# Patient Record
Sex: Female | Born: 1990 | Race: Black or African American | Hispanic: No | Marital: Married | Smoking: Never smoker
Health system: Southern US, Community
[De-identification: ages and names within clinical notes are randomized; demographics above are authoritative.]

## PROBLEM LIST (undated history)

## (undated) DIAGNOSIS — G43909 Migraine, unspecified, not intractable, without status migrainosus: Secondary | ICD-10-CM

---
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
## 2008-04-05 ENCOUNTER — Ambulatory Visit: Payer: Self-pay | Admitting: Certified Nurse Midwife

## 2008-05-09 ENCOUNTER — Emergency Department: Payer: Self-pay | Admitting: Emergency Medicine

## 2008-07-21 ENCOUNTER — Observation Stay: Payer: Self-pay

## 2008-08-25 ENCOUNTER — Inpatient Hospital Stay: Payer: Self-pay | Admitting: Obstetrics and Gynecology

## 2008-08-25 ENCOUNTER — Ambulatory Visit: Payer: Self-pay | Admitting: Certified Nurse Midwife

## 2009-11-16 IMAGING — CR DG CHEST 2V
1 series · 2 of 2 positions shown · non-contrast
Comparison: none

REASON FOR EXAM: cough - shield abdomen
COMMENTS:

[Series 1: view not recorded · 0.17mm/px · 2 of 2 slices shown]
[im 1/2]
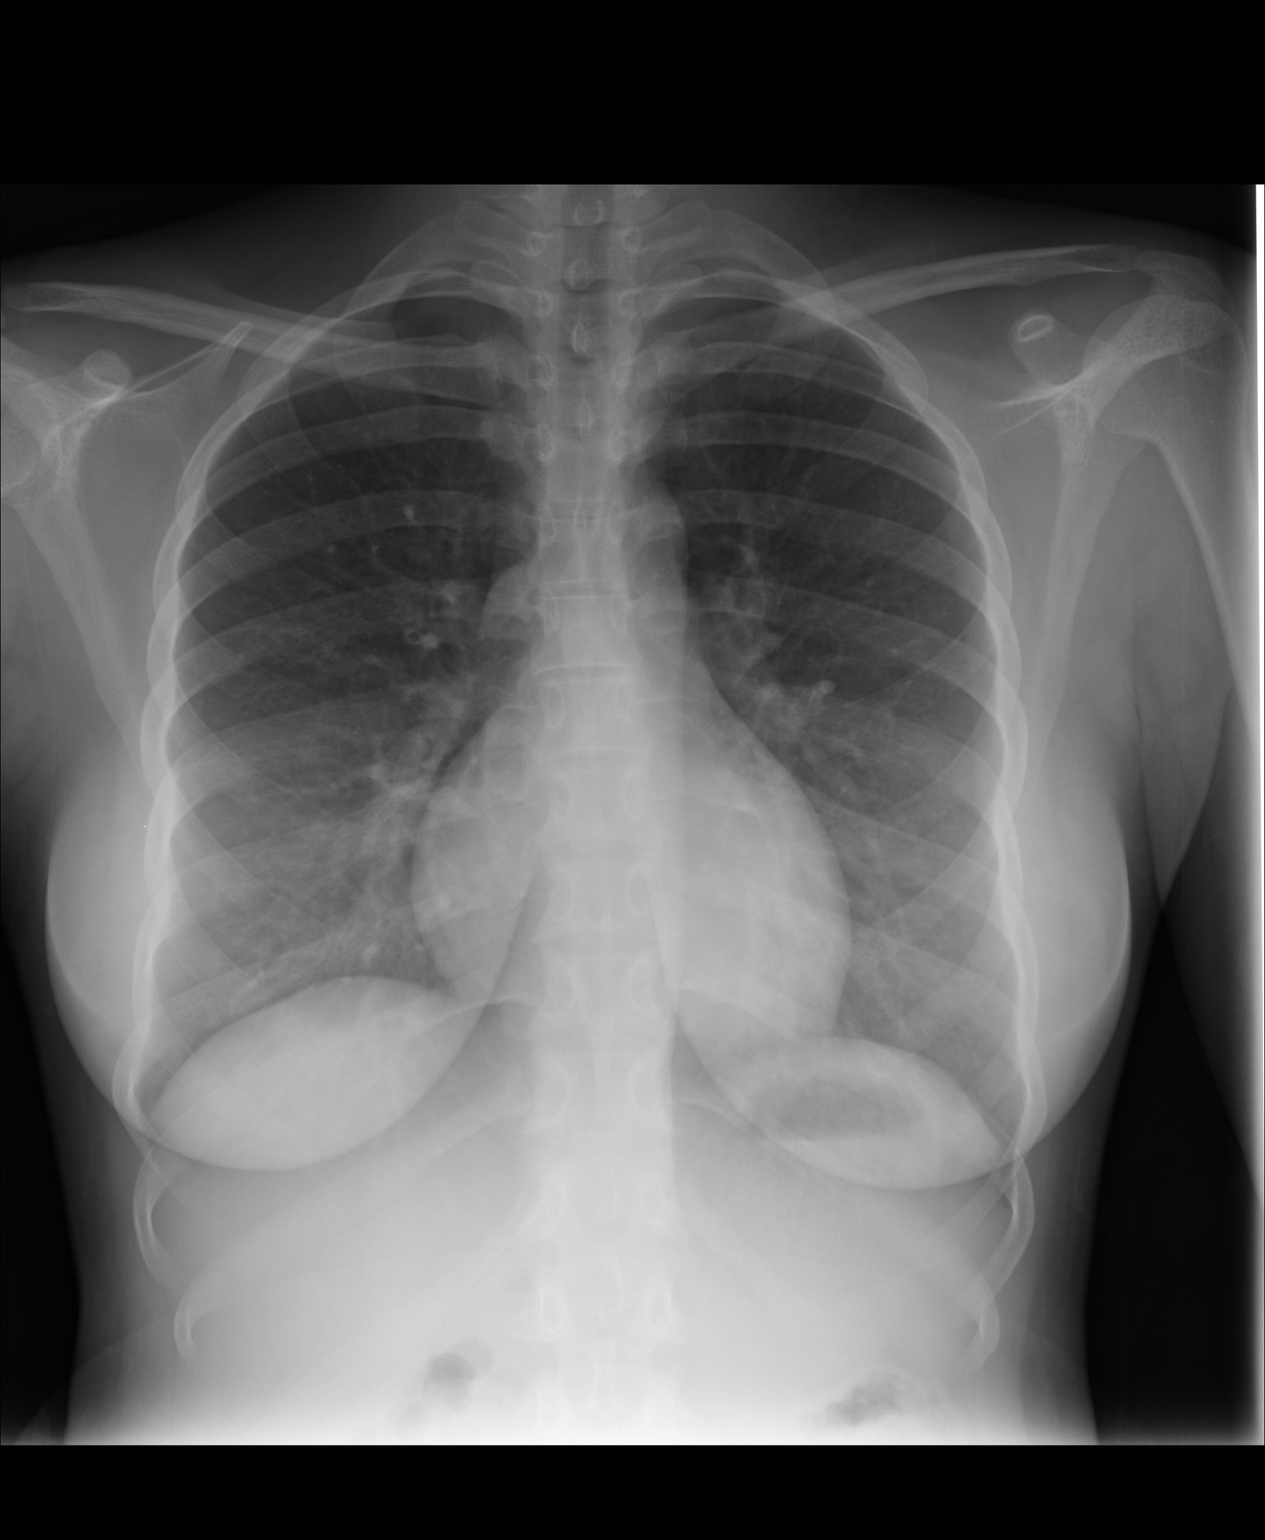
[im 2/2]
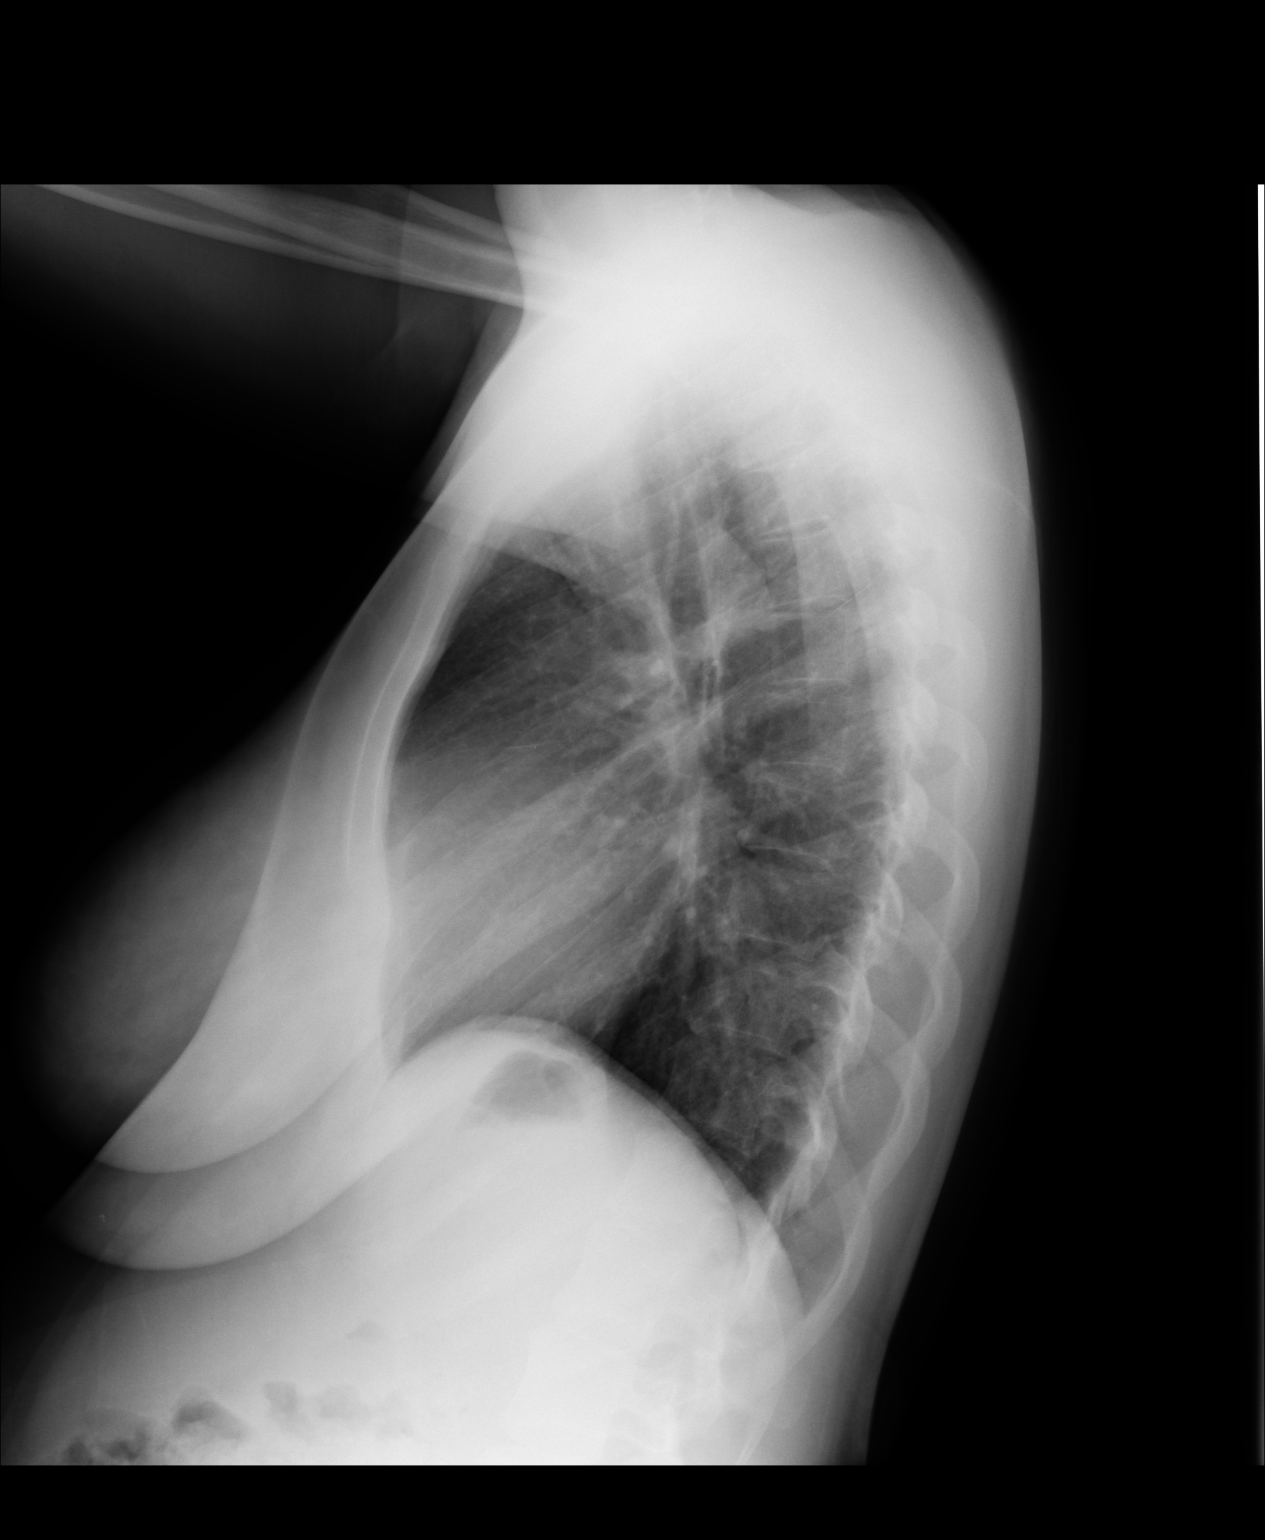

[2 of 2 positions shown; findings below may reference images not displayed]

PROCEDURE:     DXR - DXR CHEST PA (OR AP) AND LATERAL  - May 10, 2008 [DATE]

RESULT:     There is no previous examination for comparison.

There is a mild pectus excavatum deformity. The lungs are clear. The heart
and pulmonary vessels are normal. The bony and mediastinal structures are
unremarkable. There is no effusion. There is no pneumothorax or evidence of
congestive failure.
IMPRESSION: No acute cardiopulmonary disease.

## 2010-03-03 IMAGING — US US OB FOLLOW-UP
1 series · 17 of 22 positions shown · non-contrast
Comparison: none

REASON FOR EXAM: size less than dates
COMMENTS:

[Series 1: us ob follow-up · 17 of 22 slices shown]
[im 1/22]
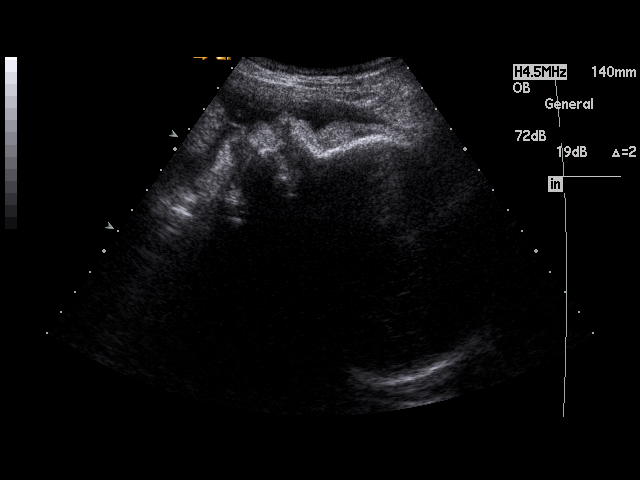
[im 2/22]
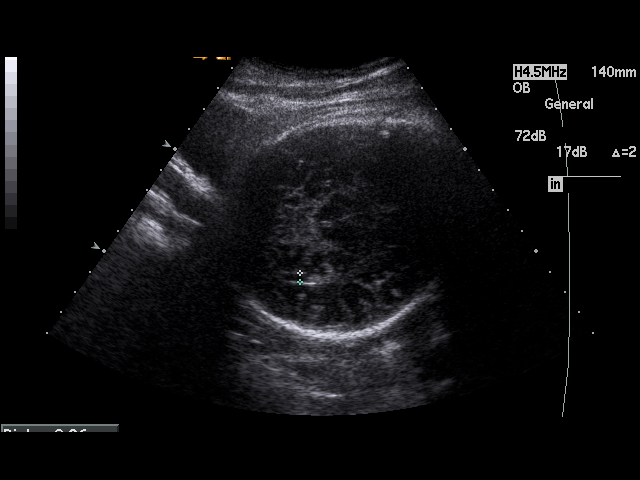
[im 4/22]
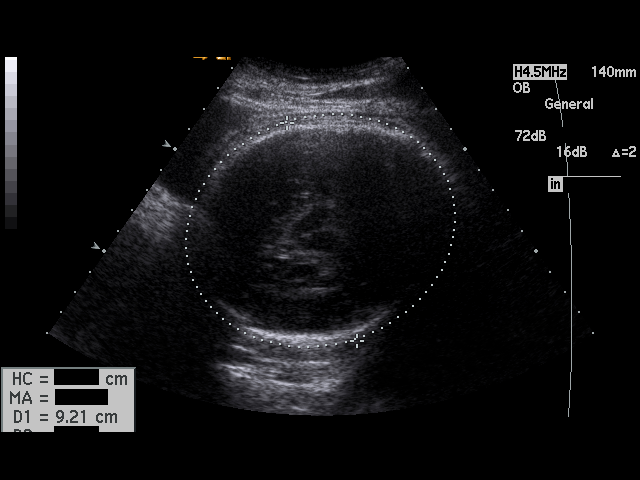
[im 5/22]
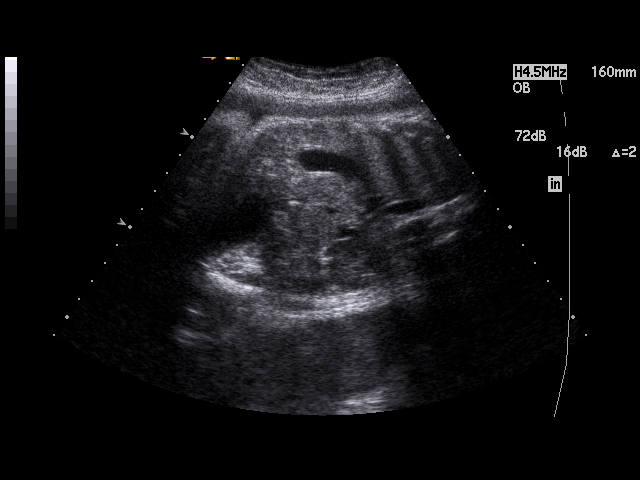
[im 6/22]
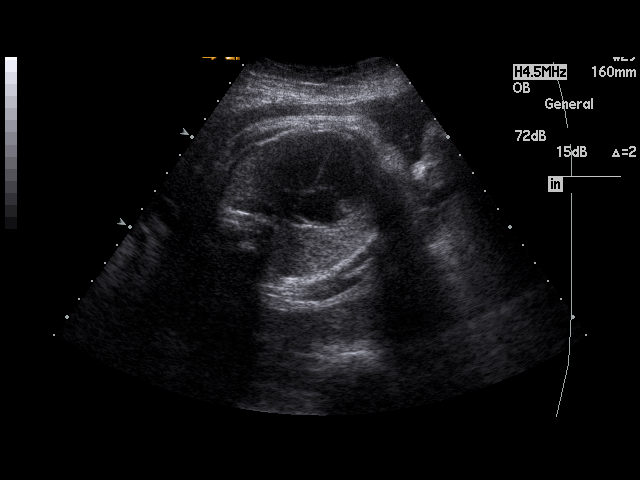
[im 8/22]
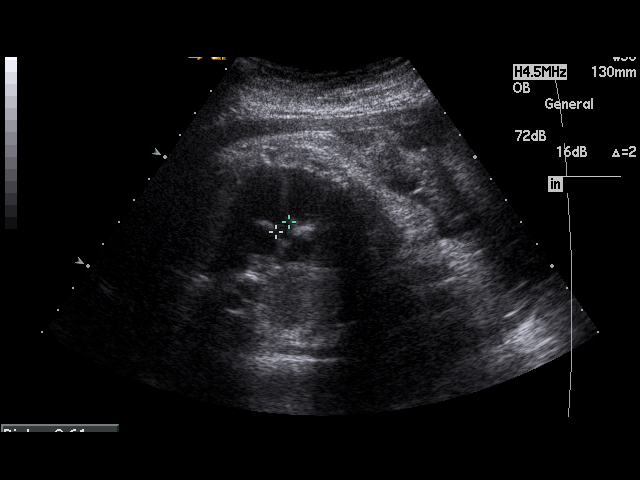
[im 9/22]
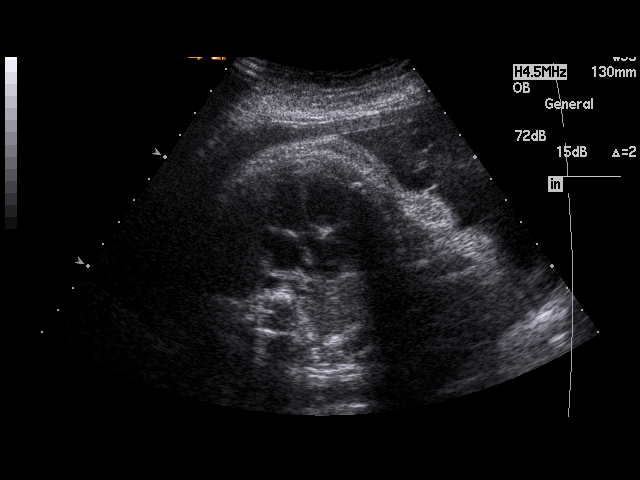
[im 10/22]
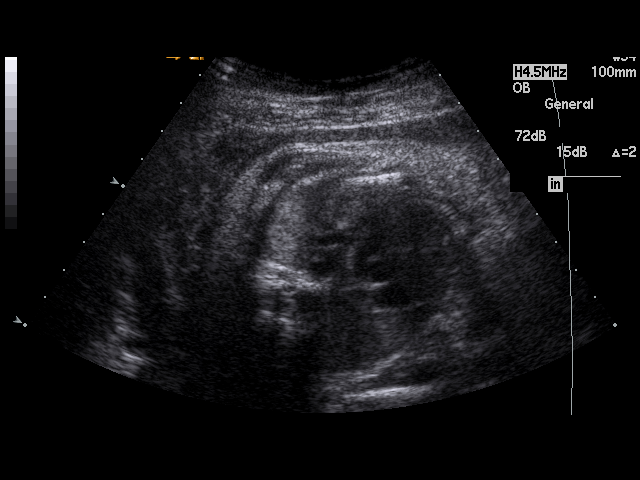
[im 12/22]
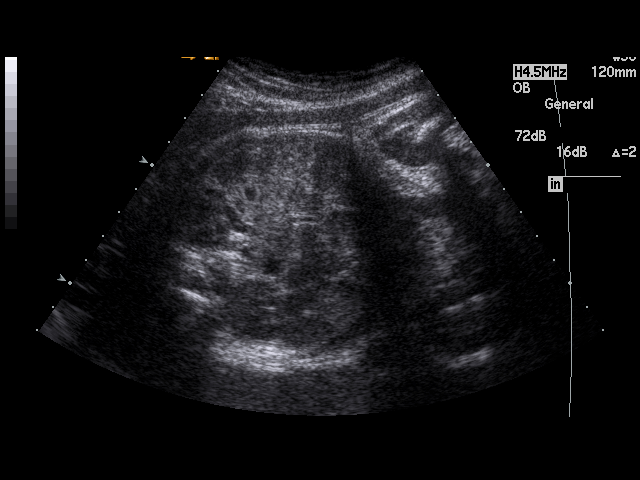
[im 13/22]
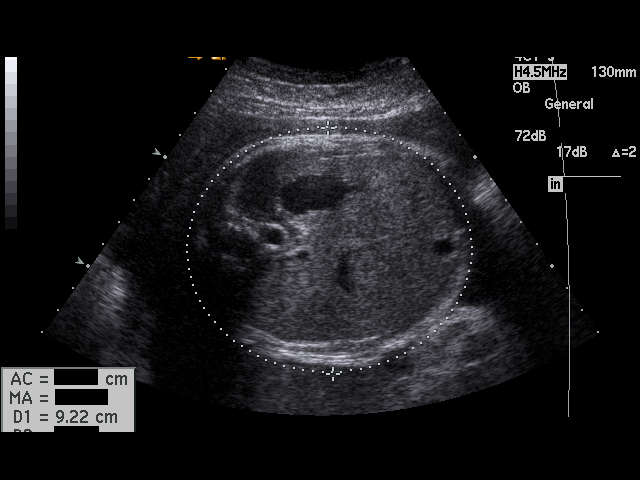
[im 14/22]
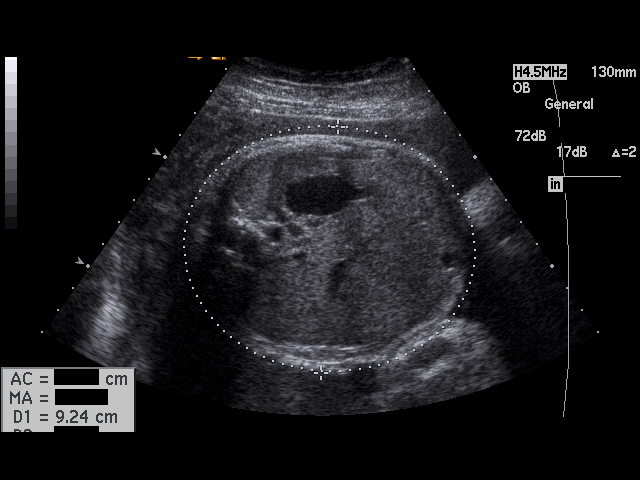
[im 15/22]
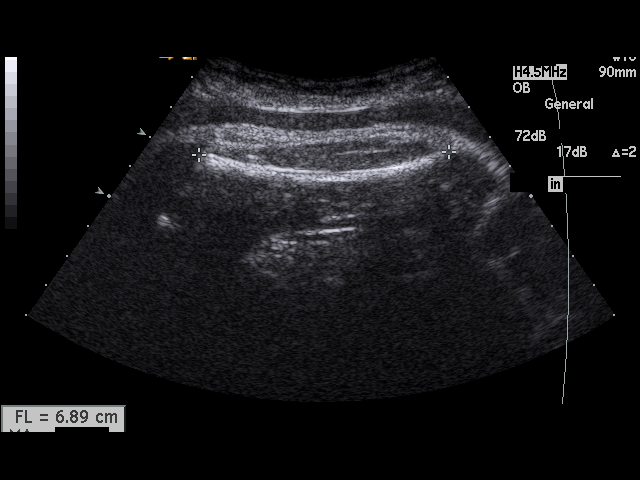
[im 17/22]
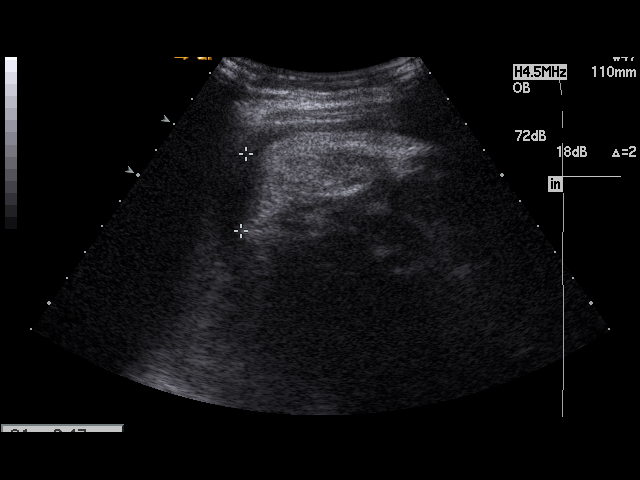
[im 18/22]
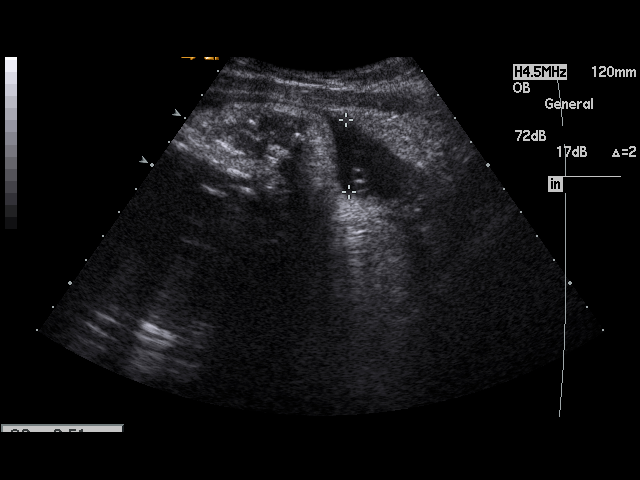
[im 19/22]
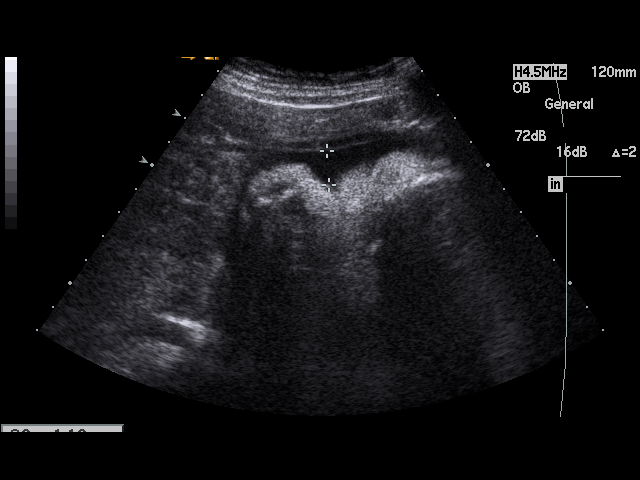
[im 21/22]
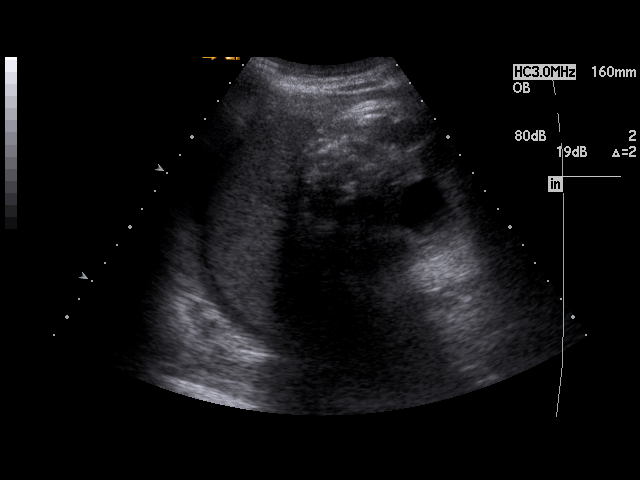
[im 22/22]
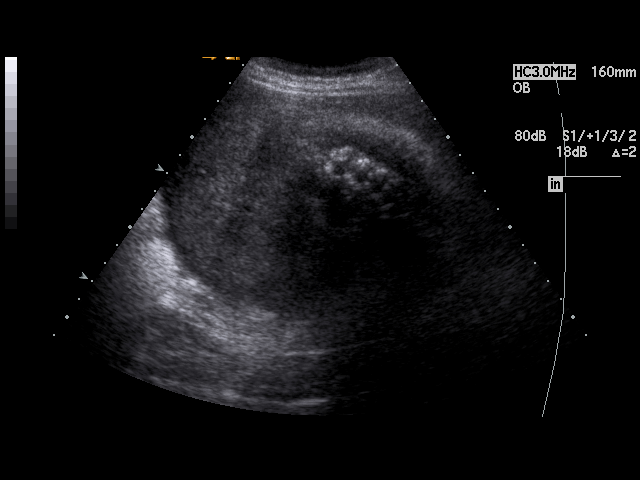

[17 of 22 positions shown; findings below may reference images not displayed]

PROCEDURE:     US  - US OB FOLLOW UP  - August 25, 2008 [DATE]

RESULT:     A single, viable intrauterine pregnancy is present with active
cardiac motion. Fetal presentation is cephalic. Amniotic fluid volume is
decreased with AFI of 8.2 which is between the 5th and 50th percentile.
Visualized fetal parts are unremarkable. This includes the fetal head,
chest, abdomen, kidneys and bladder.

Average BPD of 8.8 cm, corresponding to 35 weeks, 4 days; head circumference
of 31.5 cm, corresponding to 35 weeks, 3 days; abdominal circumference of
31.6 cm, corresponding to 35 weeks, 3 days; femur length of 6.8 cm,
corresponding to 34 weeks, 6 days are obtained with average gestational age
of 35 weeks, 2 days. Dates from prior scan should be in the 38 week range.
This along with low AFI suggests intrauterine growth retardation. Evaluation
by ultrasound at a tertiary care center should be considered.
IMPRESSION: 1.     Possible intrauterine growth retardation.
2.     Fetal heart rate of 126 beats per minute.

## 2011-01-31 ENCOUNTER — Observation Stay: Payer: Self-pay | Admitting: Obstetrics and Gynecology

## 2011-04-30 ENCOUNTER — Observation Stay: Payer: Self-pay

## 2011-05-05 ENCOUNTER — Inpatient Hospital Stay: Payer: Self-pay | Admitting: Obstetrics and Gynecology

## 2011-09-26 ENCOUNTER — Ambulatory Visit: Payer: Self-pay | Admitting: Allergy

## 2011-11-14 LAB — URINALYSIS, COMPLETE
Bilirubin,UR: NEGATIVE
Glucose,UR: NEGATIVE mg/dL (ref 0–75)
Ketone: NEGATIVE
Nitrite: NEGATIVE
Ph: 6 (ref 4.5–8.0)
Protein: NEGATIVE
Specific Gravity: 1.025 (ref 1.003–1.030)
Squamous Epithelial: 1
WBC UR: 68 /HPF (ref 0–5)

## 2011-11-14 LAB — COMPREHENSIVE METABOLIC PANEL
Alkaline Phosphatase: 103 U/L (ref 50–136)
Anion Gap: 10 (ref 7–16)
BUN: 5 mg/dL — ABNORMAL LOW (ref 7–18)
Bilirubin,Total: 0.5 mg/dL (ref 0.2–1.0)
Calcium, Total: 9 mg/dL (ref 8.5–10.1)
Chloride: 109 mmol/L — ABNORMAL HIGH (ref 98–107)
Co2: 20 mmol/L — ABNORMAL LOW (ref 21–32)
Creatinine: 0.73 mg/dL (ref 0.60–1.30)
EGFR (African American): >60
EGFR (Non-African Amer.): >60
Glucose: 107 mg/dL — ABNORMAL HIGH (ref 65–99)
Osmolality: 275 (ref 275–301)
SGOT(AST): 16 U/L (ref 15–37)
Sodium: 139 mmol/L (ref 136–145)

## 2011-11-14 LAB — CBC
HCT: 33 % — ABNORMAL LOW (ref 35.0–47.0)
HGB: 10.6 g/dL — ABNORMAL LOW (ref 12.0–16.0)
MCH: 27.7 pg (ref 26.0–34.0)
MCV: 86 fL (ref 80–100)
Platelet: 274 10*3/uL (ref 150–440)
RBC: 3.83 10*6/uL (ref 3.80–5.20)
RDW: 18.5 % — ABNORMAL HIGH (ref 11.5–14.5)
WBC: 12.9 10*3/uL — ABNORMAL HIGH (ref 3.6–11.0)

## 2011-11-14 LAB — PREGNANCY, URINE: Pregnancy Test, Urine: POSITIVE m[IU]/mL

## 2011-11-15 ENCOUNTER — Ambulatory Visit: Payer: Self-pay | Admitting: Obstetrics and Gynecology

## 2011-11-15 LAB — WET PREP, GENITAL

## 2011-11-15 LAB — HCG, QUANTITATIVE, PREGNANCY: Beta Hcg, Quant.: 3546 m[IU]/mL — ABNORMAL HIGH

## 2011-11-19 LAB — PATHOLOGY REPORT

## 2012-04-28 ENCOUNTER — Ambulatory Visit: Payer: Self-pay | Admitting: Family Medicine

## 2013-03-15 ENCOUNTER — Ambulatory Visit: Payer: Self-pay | Admitting: Family Medicine

## 2013-03-17 ENCOUNTER — Ambulatory Visit: Payer: Self-pay | Admitting: Physician Assistant

## 2013-05-22 IMAGING — US US OB < 14 WEEKS - US OB TV
1 series · 17 of 28 positions shown · non-contrast
Comparison: none

REASON FOR EXAM: pos preg test post tubal ligation
COMMENTS:

[Series 1: us ob < 14 weeks - us ob tv · 17 of 87 slices shown]
[im 1/87]
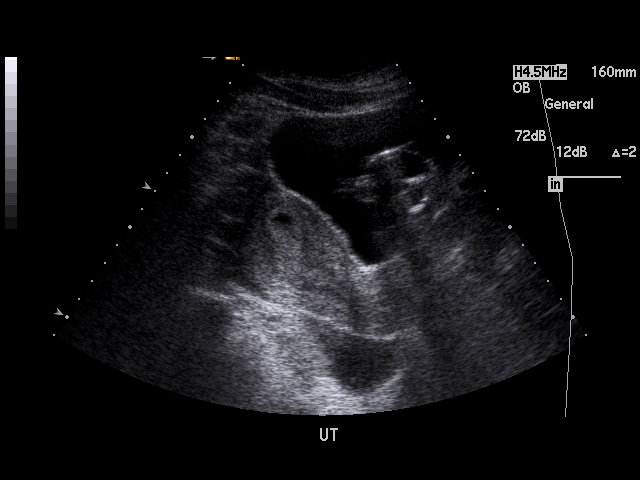
[im 7/87]
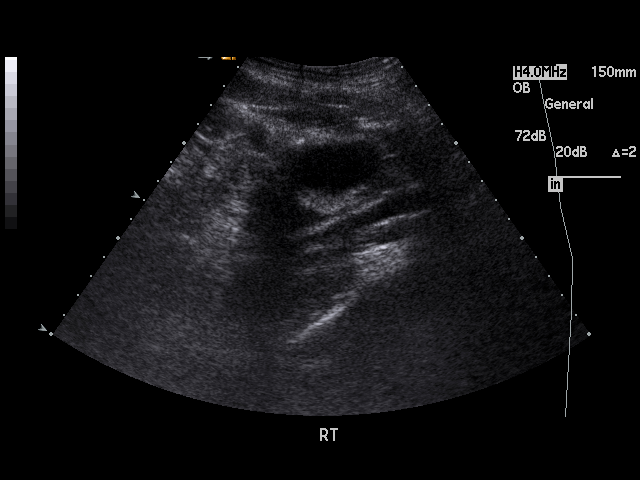
[im 13/87]
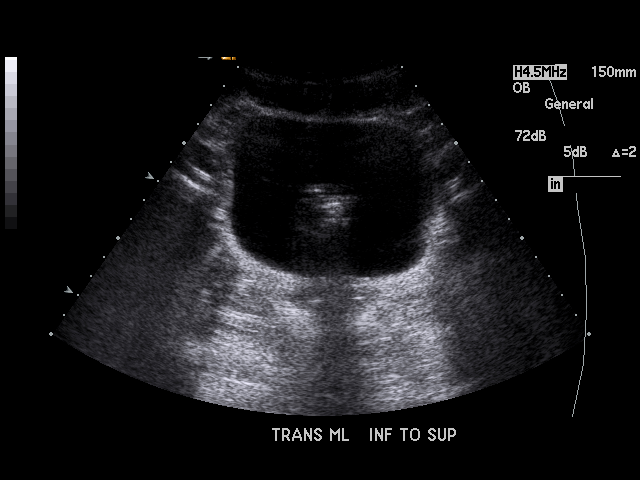
[im 16/87]
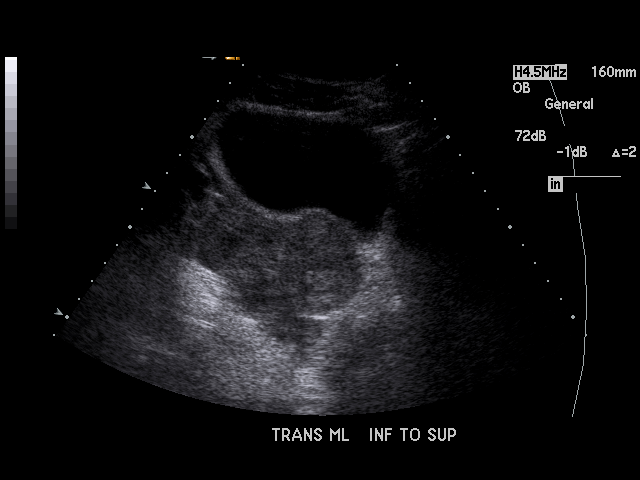
[im 23/87]
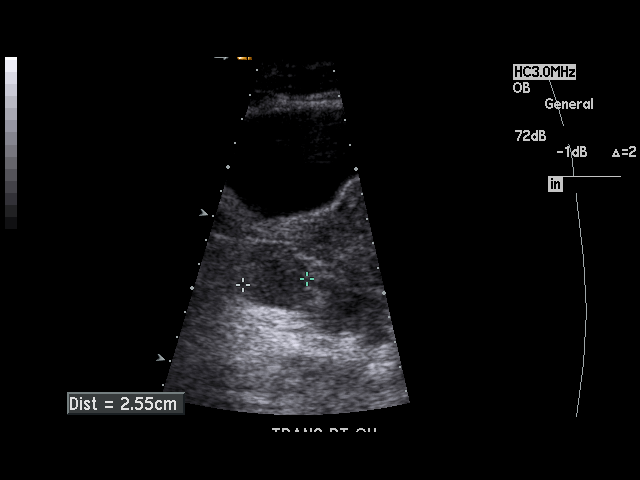
[im 29/87]
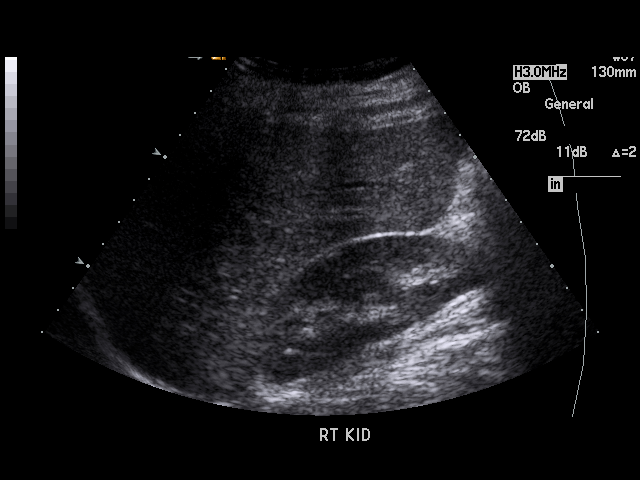
[im 32/87]
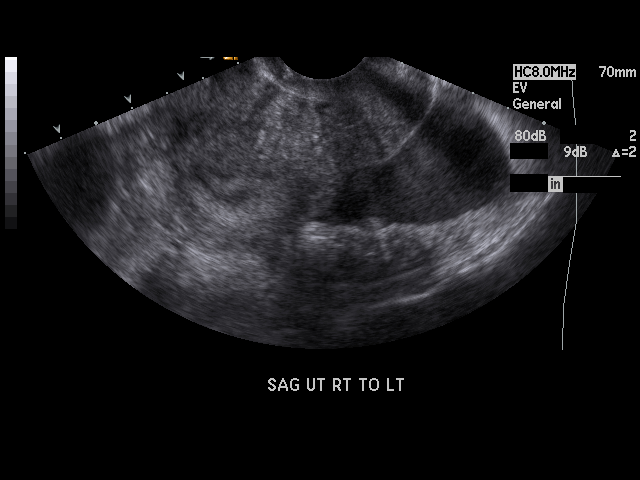
[im 39/87]
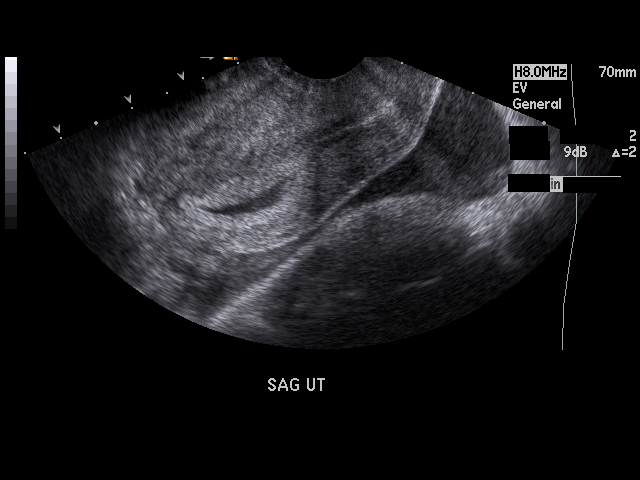
[im 45/87]
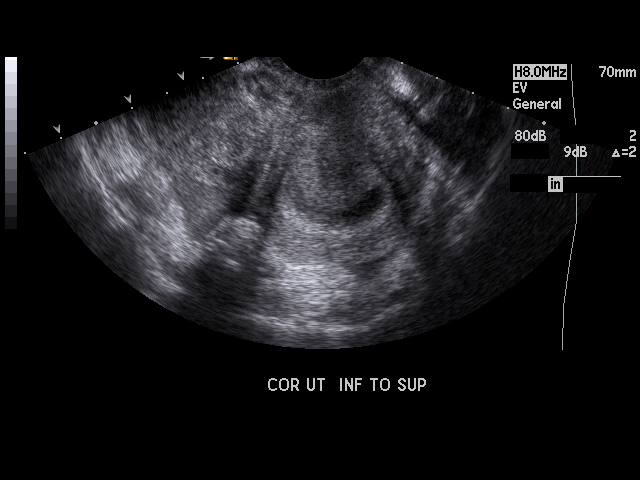
[im 48/87]
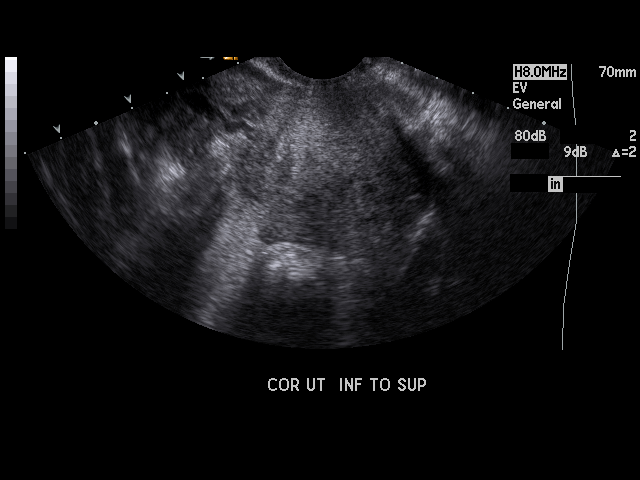
[im 55/87]
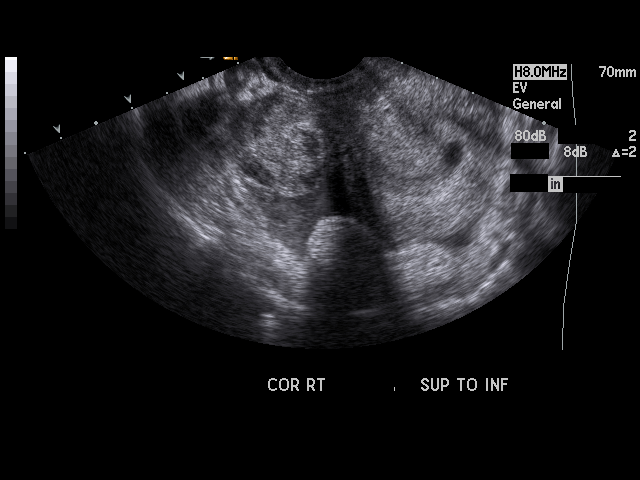
[im 58/87]
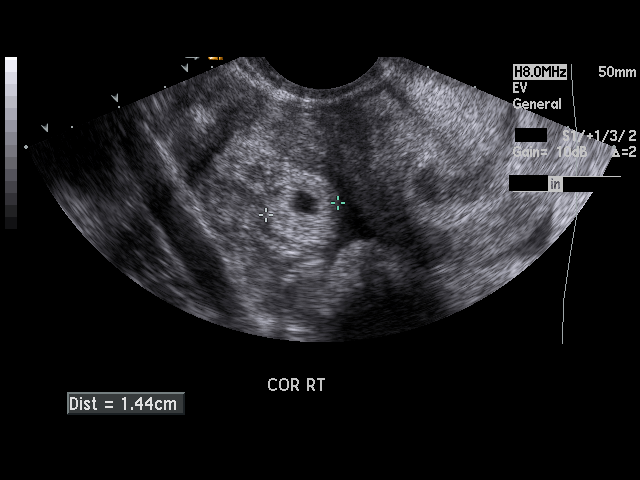
[im 64/87]
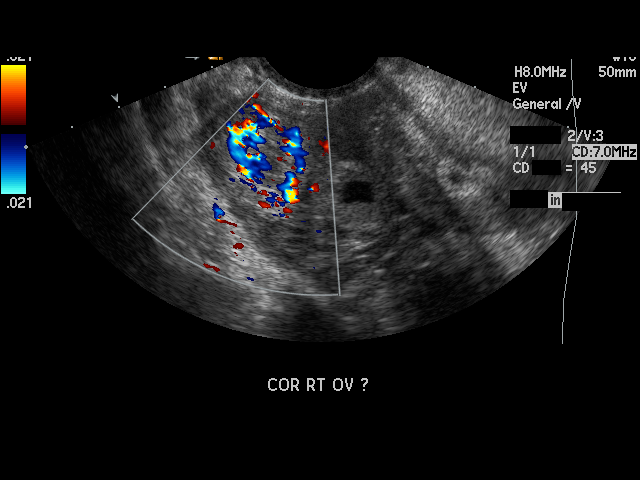
[im 71/87]
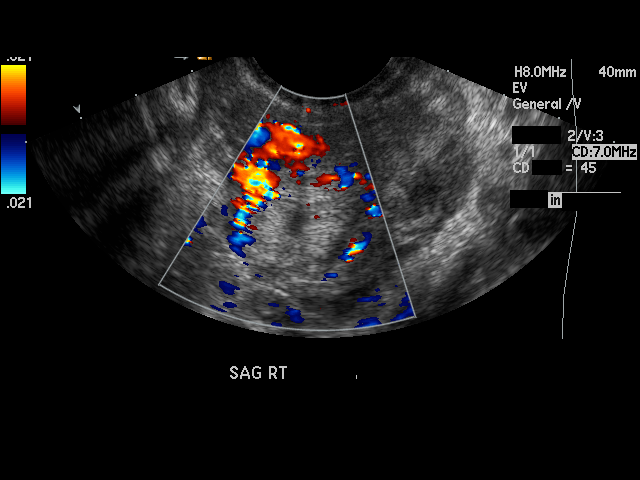
[im 74/87]
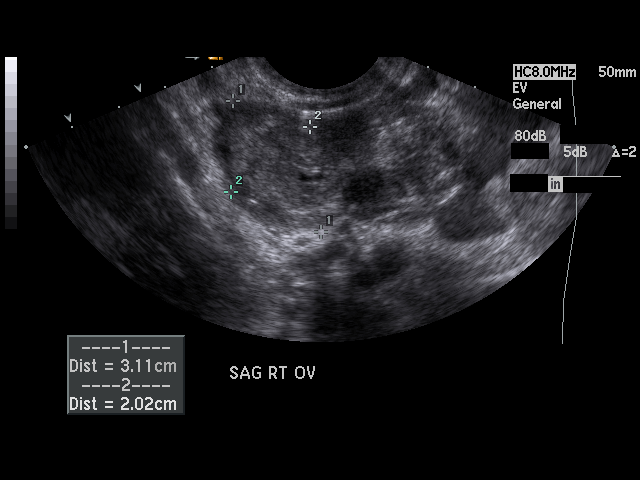
[im 80/87]
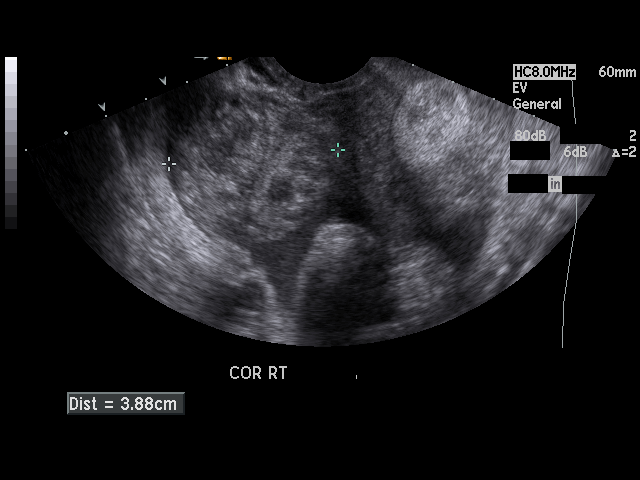
[im 87/87]
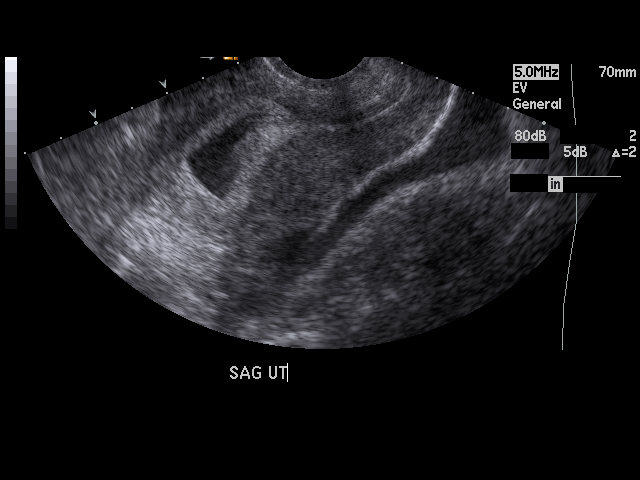

[17 of 28 positions shown; findings below may reference images not displayed]

PROCEDURE:     US  - US OB LESS THAN 14 WEEKS/W TRANS  - November 15, 2011  [DATE]

RESULT:     Early OB protocol examination is performed. The study
demonstrates fluid or blood with an echogenic appearance in the retrouterine
cul-de-sac. There is fluid within the endometrium with a mean sac diameter
of 2.73 cm which would correlate with a 7 week, 4 day gestation. Within
this, there is no evidence of a fetal pole which should certainly be visible
on endovaginal imaging. A yolk sac is not seen in this location. Within the
right adnexa, periuterine region, there is a rounded area of echogenicity
with some low attenuation within it concerning for ectopic gestation. Within
this, there appears to be a 2.7 mm possible yolk sac. No fetal pole is
identified. There is hyperemia surrounding this structure. The right ovary
measures 5.22 x 3.99 x 3.88 cm and has a somewhat heterogeneous appearance.
The left ovary measures 2.55 x 1.27 x 1.72 cm. Blood flow is documented to
both ovaries.
IMPRESSION: Findings concerning for right ectopic gestation possibly
within the tube. Presumed blood is seen within the pelvis. Probable
pseudogestational sac within the endometrial cavity.

The findings were called to the [HOSPITAL] the completion of
the exam.

[REDACTED]

## 2013-06-17 ENCOUNTER — Ambulatory Visit: Payer: Self-pay | Admitting: Ophthalmology

## 2013-08-19 ENCOUNTER — Ambulatory Visit: Payer: Self-pay | Admitting: Family Medicine

## 2013-10-20 ENCOUNTER — Ambulatory Visit: Payer: Self-pay | Admitting: Emergency Medicine

## 2013-10-31 ENCOUNTER — Ambulatory Visit: Payer: Self-pay

## 2013-10-31 LAB — WET PREP, GENITAL

## 2013-11-01 LAB — GC/CHLAMYDIA PROBE AMP

## 2014-02-26 ENCOUNTER — Ambulatory Visit: Payer: Self-pay | Admitting: Physician Assistant

## 2014-10-29 NOTE — Op Note (Signed)
PATIENT NAME:  Misty SalisburyURNER, [name] MR#:  [id number] DATE OF BIRTH:  02/04/1991  DATE OF PROCEDURE:  11/15/2011  PREOPERATIVE DIAGNOSIS: Right-sided tubal pregnancy.   POSTOPERATIVE DIAGNOSIS: Right-sided tubal pregnancy.   PROCEDURES:  1. Dilatation and curettage. 2. Diagnostic laparoscopy.  3. Right salpingectomy. 4. Cautery of left fallopian tube.   SURGEON: Ricky L. Logan BoresEvans, MD  ANESTHESIA: Dr. Henrene HawkingKephart, general endotracheal.   FINDINGS: Apparent decidual tissue from endometrial curettings. Blood in the abdomen, approximately 100 mL. Dilated apparently blood-filled right fallopian tube presumed to be ectopic pregnancy. Evidence of postpartum tubal ligation bilaterally. Grossly normal ovaries. Grossly normal uterus.   ESTIMATED BLOOD LOSS: Minimal for procedure. Again approximately 100 mL of blood in the abdomen.   DRAINS: Foley.   COMPLICATIONS: None.   SPECIMENS:  1. Endometrial curettings 2. Right tube.    PROCEDURE IN DETAIL: Patient presented to the ER with findings as noted above. Recommended suction dilation and curettage and diagnostic laparoscopy with probable right salpingectomy and cauterization of left fallopian tube. Questions addressed. Patient gave consent. Preoperative antibiotics given. Taken to the Operating Room, placed in supine position. Anesthesia was initiated. Placed in dorsal lithotomy position using bumblebee stirrups, prepped and draped in the usual sterile fashion. Foley was inserted. Cervix was visualized with bivalve speculum, grasped with a single-tooth tenaculum and dilated to admit a #8 suction curette which was passed without difficulty with return of what appeared to be decidual tissue. This was sent separately labeled as endometrial curettings. Single-tooth was switched out for [place] and we turned our attention to the abdomen.   Vertical infraumbilical incision was made through which an 11 port was placed and pneumoperitoneum was established under direct  visualization with findings as noted above. Under direct visualization 11 port placed left lower quadrant and 5 in the right lower quadrant and alternating cautery and sharp proceeded with salpingectomy on the right.   Visualized the left fallopian tube. There was evidence of postpartum tubal ligation and this area was cauterized with Kleppinger.   Pelvis was copiously irrigated. Pressure was lowered to 6 mmHg. No evidence of bleeding was noted. Gross blood was evacuated. Pneumoperitoneum was allowed to resolve. Ports removed. Incisions closed with deep of 0, subcutaneous with 3-0 Vicryl. Steri-Strips and Band-Aids placed.   All instrument, needle, and sponge counts were correct. Patient tolerated procedure well. Foley was left in situ. Anticipate a routine postoperative course.   ____________________________ Reatha Harpsicky L. Logan BoresEvans, MD rle:cms D: 11/15/2011 03:50:19 ET T: 11/15/2011 15:41:41 ET JOB#: [id number]  cc: Ricky L. Logan BoresEvans, MD, <Dictator> Augustina MoodICK L Laney Bagshaw MD ELECTRONICALLY SIGNED 11/17/2011 8:49

## 2014-10-29 NOTE — H&P (Signed)
    Subjective/Chief Complaint 24 y/o BF 7 mo's s/p ppBTL by Dr Janene HarveyKlett Right tubal ectopic    History of Present Illness pain for 1 day + upt U/S C/w rt ectopic with apparent psaude-sac intrauterine    Past History ppBTL 10/12 by Dr Janene HarveyKlett [facility]   Past Med/Surgical Hx:  Migraines:   Asthma:   ALLERGIES:  No Known Allergies:   Review of Systems:   Subjective/Chief Complaint RLQ pain    Fever/Chills No    Cough No    Sputum No    Abdominal Pain Yes    Diarrhea No    Constipation No    Nausea/Vomiting No    SOB/DOE No    Chest Pain No    Dysuria No    Tolerating Diet Yes   Physical Exam:   GEN no acute distress    HEENT PERRL    NECK supple    RESP normal resp effort    CARD regular rate    ABD positive tenderness    EXTR negative cyanosis/clubbing    SKIN normal to palpation     Assessment/Admission Diagnosis Probable Rt ectopic S/P ppBTL    Plan D&C Rt Salpingectomy   Electronic Signatures: Margaretha GlassingEvans, Ricky L (MD)  (Signed 11-May-13 01:51)  Authored: CHIEF COMPLAINT and HISTORY, PAST MEDICAL/SURGIAL HISTORY, ALLERGIES, REVIEW OF SYSTEMS, PHYSICAL EXAM, ASSESSMENT AND PLAN   Last Updated: 11-May-13 01:51 by Margaretha GlassingEvans, Ricky L (MD)

## 2014-12-24 IMAGING — CR DG CHEST 2V
1 series · 2 of 2 positions shown · non-contrast
Comparison: None.

CLINICAL DATA: Asthma; sarcoidosis

EXAM:
CHEST  2 VIEW

[Series 1: w chest pa · 0.14mm/px · 2 of 2 slices shown]
[im 1/2]
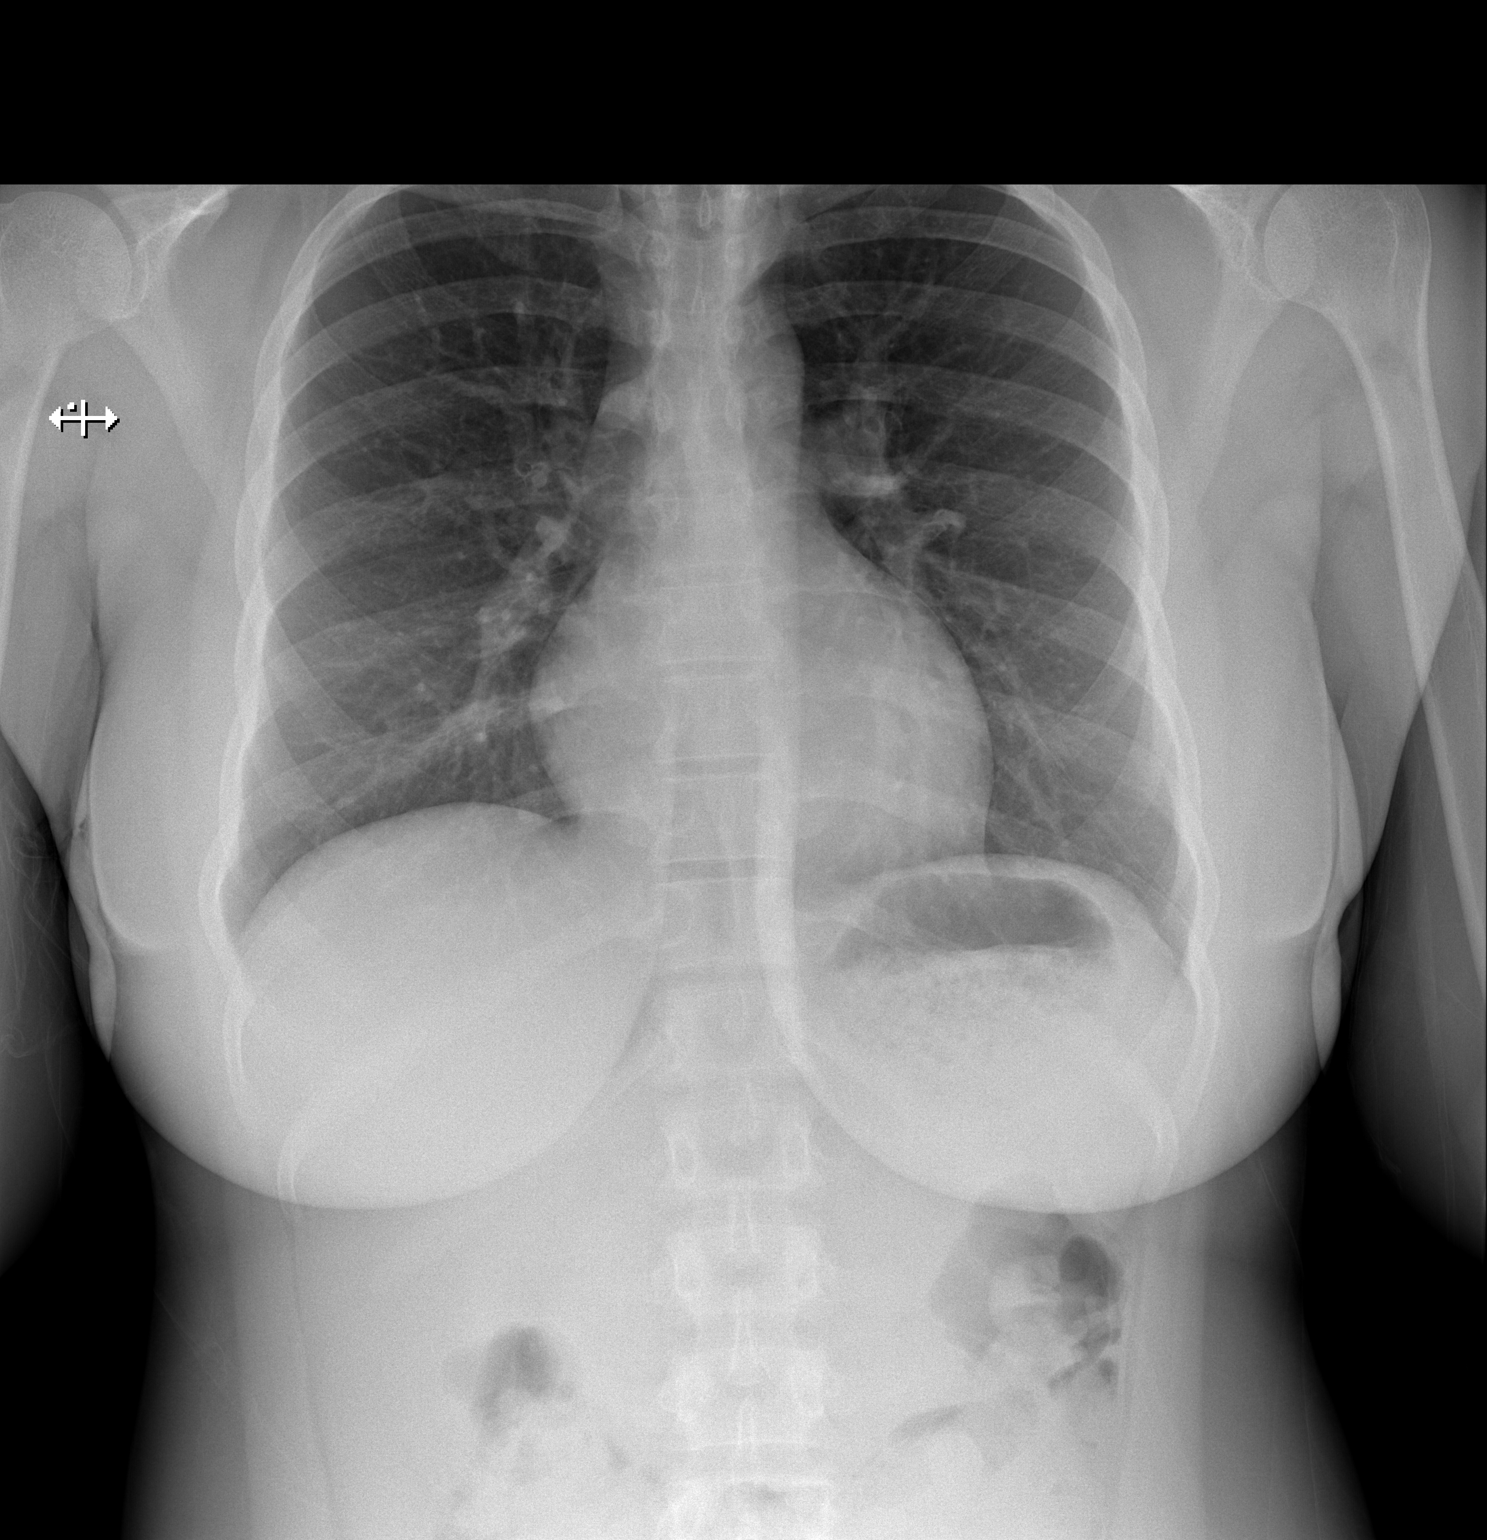
[im 2/2]
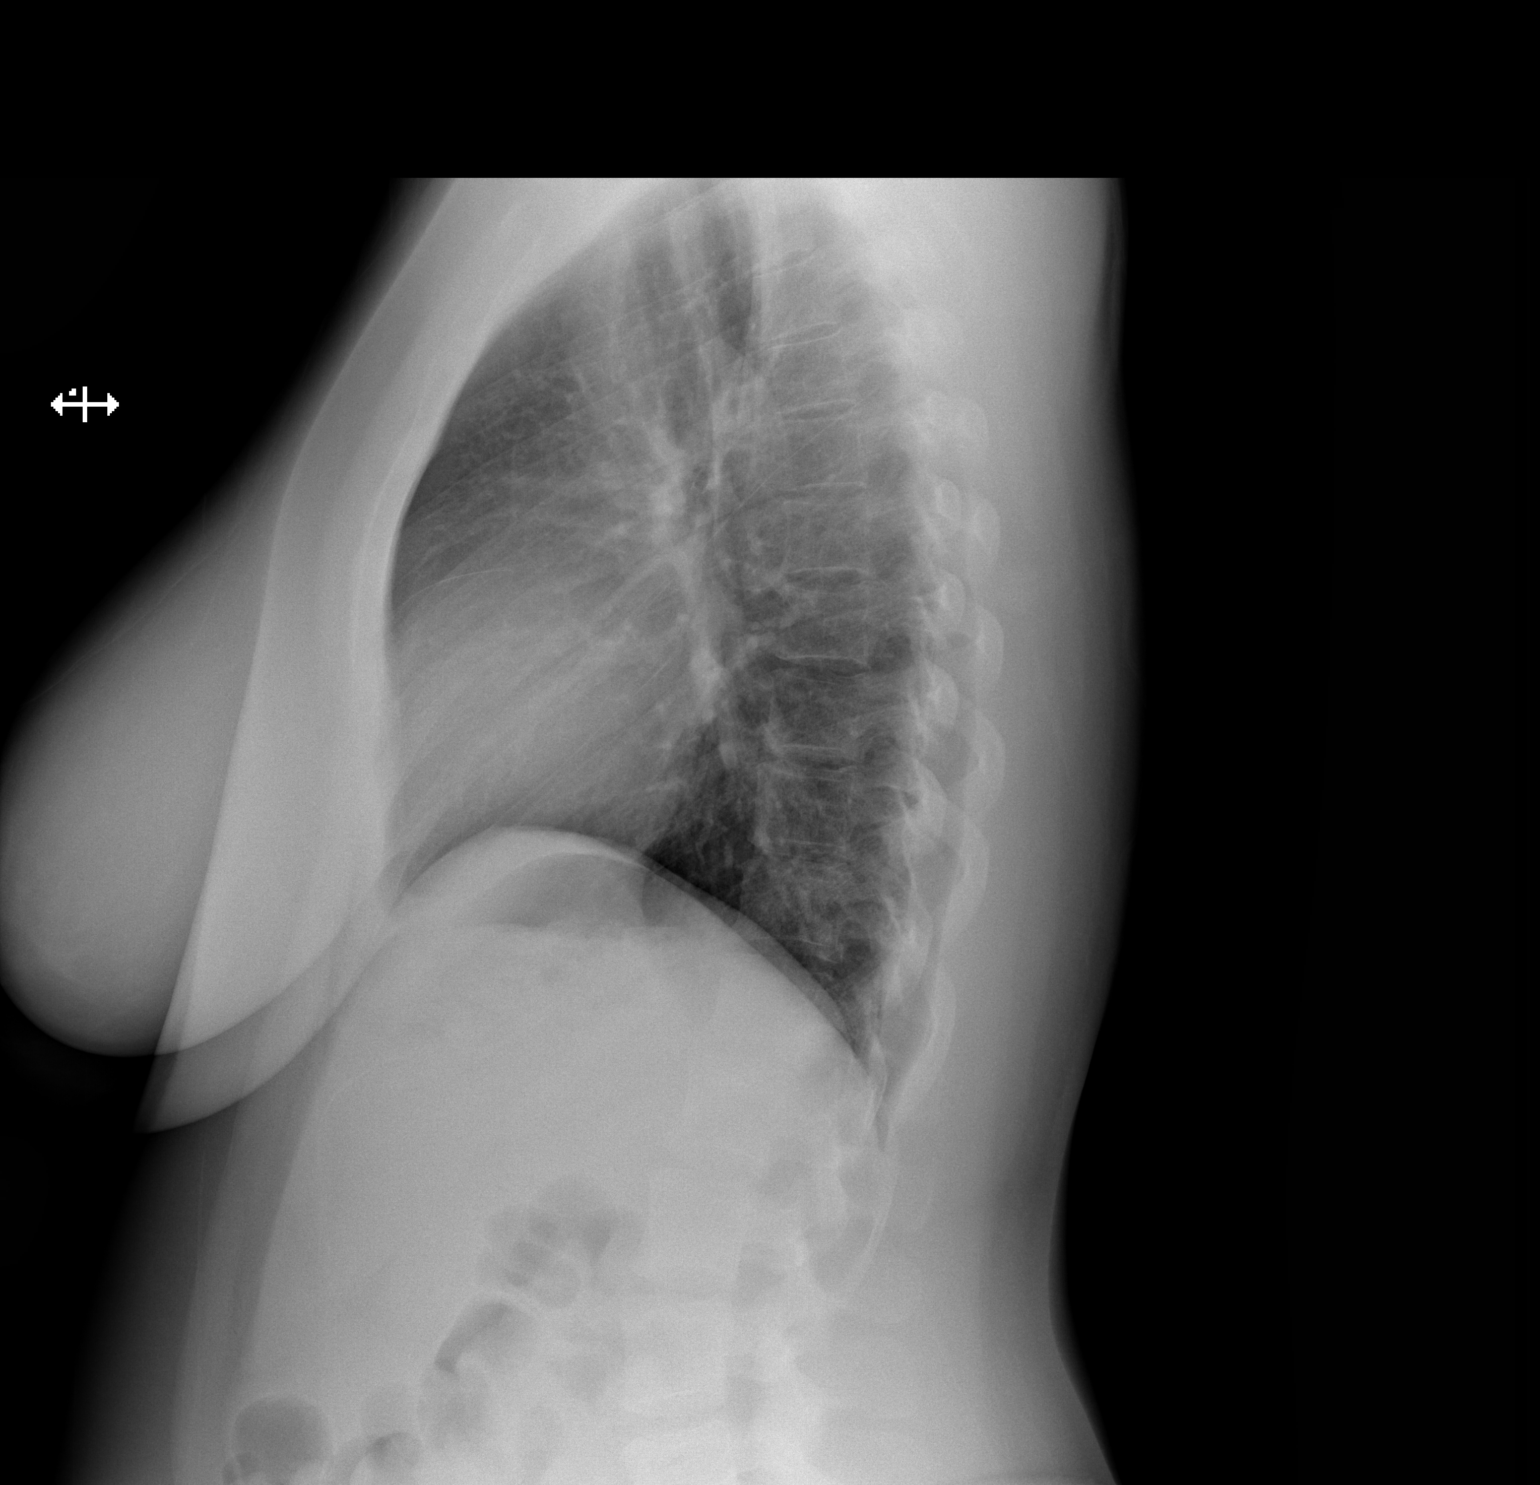

[2 of 2 positions shown; findings below may reference images not displayed]

FINDINGS: Lungs are clear. Heart size and pulmonary vascularity are normal. No
adenopathy. No bone lesions.
IMPRESSION: No abnormality noted.

## 2016-01-31 ENCOUNTER — Emergency Department
Admission: EM | Admit: 2016-01-31 | Discharge: 2016-01-31 | Disposition: A | Discharge status: Home / Self Care | Payer: Managed Care, Other (non HMO) | Attending: Student | Admitting: Student

## 2016-01-31 ENCOUNTER — Emergency Department: Payer: Managed Care, Other (non HMO)

## 2016-01-31 ENCOUNTER — Encounter: Payer: Self-pay | Admitting: Emergency Medicine

## 2016-01-31 DIAGNOSIS — R531 Weakness: Secondary | ICD-10-CM

## 2016-01-31 DIAGNOSIS — R51 Headache: Secondary | ICD-10-CM | POA: Diagnosis present

## 2016-01-31 DIAGNOSIS — R110 Nausea: Secondary | ICD-10-CM

## 2016-01-31 HISTORY — DX: Migraine, unspecified, not intractable, without status migrainosus: G43.909

## 2016-01-31 LAB — COMPREHENSIVE METABOLIC PANEL
ALBUMIN: 4 g/dL (ref 3.5–5.0)
ALT: 12 U/L — ABNORMAL LOW (ref 14–54)
ANION GAP: 6 (ref 5–15)
AST: 18 U/L (ref 15–41)
Alkaline Phosphatase: 65 U/L (ref 38–126)
BILIRUBIN TOTAL: 2.5 mg/dL — AB (ref 0.3–1.2)
BUN: 8 mg/dL (ref 6–20)
CHLORIDE: 107 mmol/L (ref 101–111)
CO2: 25 mmol/L (ref 22–32)
Calcium: 9.6 mg/dL (ref 8.9–10.3)
Creatinine, Ser: 0.86 mg/dL (ref 0.44–1.00)
GFR calc Af Amer: >60 mL/min (ref >60)
GFR calc non Af Amer: >60 mL/min (ref >60)
GLUCOSE: 101 mg/dL — AB (ref 65–99)
POTASSIUM: 4.2 mmol/L (ref 3.5–5.1)
SODIUM: 138 mmol/L (ref 135–145)
TOTAL PROTEIN: 7.6 g/dL (ref 6.5–8.1)

## 2016-01-31 LAB — CBC WITH DIFFERENTIAL/PLATELET
BASOS ABS: 0.1 10*3/uL (ref 0–0.1)
BASOS PCT: 1 %
EOS ABS: 0.1 10*3/uL (ref 0–0.7)
EOS PCT: 1 %
HEMATOCRIT: 39.6 % (ref 35.0–47.0)
Hemoglobin: 13.4 g/dL (ref 12.0–16.0)
Lymphocytes Relative: 21 %
Lymphs Abs: 1.4 10*3/uL (ref 1.0–3.6)
MCH: 30.4 pg (ref 26.0–34.0)
MCHC: 33.8 g/dL (ref 32.0–36.0)
MCV: 90 fL (ref 80.0–100.0)
MONO ABS: 0.5 10*3/uL (ref 0.2–0.9)
MONOS PCT: 7 %
Neutro Abs: 5 10*3/uL (ref 1.4–6.5)
Neutrophils Relative %: 70 %
PLATELETS: 199 10*3/uL (ref 150–440)
RBC: 4.4 MIL/uL (ref 3.80–5.20)
RDW: 13.7 % (ref 11.5–14.5)
WBC: 7 10*3/uL (ref 3.6–11.0)

## 2016-01-31 LAB — URINALYSIS COMPLETE WITH MICROSCOPIC (ARMC ONLY)
BILIRUBIN URINE: NEGATIVE
Bacteria, UA: NONE SEEN
Glucose, UA: NEGATIVE mg/dL
HGB URINE DIPSTICK: NEGATIVE
KETONES UR: NEGATIVE mg/dL
Nitrite: NEGATIVE
PH: 5 (ref 5.0–8.0)
PROTEIN: NEGATIVE mg/dL
Specific Gravity, Urine: 1.023 (ref 1.005–1.030)

## 2016-01-31 LAB — POCT PREGNANCY, URINE: PREG TEST UR: NEGATIVE

## 2016-01-31 LAB — BILIRUBIN, DIRECT: Bilirubin, Direct: 0.2 mg/dL (ref 0.1–0.5)

## 2016-01-31 MED ORDER — SODIUM CHLORIDE 0.9 % IV BOLUS (SEPSIS)
1000.0000 mL | Freq: Once | INTRAVENOUS | Status: AC
  Administered 2016-01-31: 1000 mL via INTRAVENOUS

## 2016-01-31 NOTE — ED Notes (Signed)
Pt returned from US, resting in bed 

## 2016-01-31 NOTE — ED Triage Notes (Signed)
Patient ambulatory to triage with steady gait, without difficulty or distress noted; pt reports generalized HA since last night; st hx migraines

## 2016-01-31 NOTE — ED Notes (Signed)
Pt resting in bed, watching TV. Pt's family is at the bedside.

## 2016-01-31 NOTE — Discharge Instructions (Signed)
You were seen in the emergency department with for symptoms which are likely related to mild dehydration. Additionally, your bilirubin level was mildly elevated today and the cause of this is unclear. You need to follow up with your primary care doctor soon as possible for recheck and for additional testing if needed. Return immediately to the emergency department if you develop chest pain, difficulty breathing, vomiting, diarrhea, fevers, chills, abdominal pain, lightheadedness, fainting or near fainting or for any other concerns.

## 2016-01-31 NOTE — ED Provider Notes (Signed)
[facility] Emergency Department Provider Note   ____________________________________________   First MD Initiated Contact with Patient 01/31/16 [phone]     (approximate)  I have reviewed the triage vital signs and the nursing notes.   HISTORY  Chief Complaint Headache Generalized Weakness   HPI Misty Osborn is a 25 y.o. female with history of migraine headaches who presents for evaluation of 3 days of generalized weakness, fatigue, poor appetite she thinks may be heat related, gradual onset, constant, mild to moderate, worse when she is at work working in a [employer]. She reports she has felt thirsty all the time that she has been attempting to hydrate with water and Gatorade but feels that this is insufficient. She had a mild migraine headache this morning but reports that this has resolved at this time. No chest pain or difficulty breathing. No vomiting, diarrhea, fevers or chills. She has felt nauseated at work. No pain or burning with urination.   Past Medical History:  Diagnosis Date  . Migraines     There are no active problems to display for this patient.   History reviewed. No pertinent surgical history.  Prior to Admission medications   Not on File    Allergies Review of patient's allergies indicates no known allergies.  No family history on file.  Social History Social History  Substance Use Topics  . Smoking status: Never Smoker  . Smokeless tobacco: Never Used  . Alcohol use No    Review of Systems Constitutional: No fever/chills Eyes: No visual changes. ENT: No sore throat. Cardiovascular: Denies chest pain. Respiratory: Denies shortness of breath. Gastrointestinal: No abdominal pain.  + nausea, no vomiting.  No diarrhea.  No constipation. Genitourinary: Negative for dysuria. Musculoskeletal: Negative for back pain. Skin: Negative for rash. Neurological: Positive for mild migraine headache, no focal weakness or  numbness.  10-point ROS otherwise negative.  ____________________________________________   PHYSICAL EXAM:  VITAL SIGNS: ED Triage Vitals  Enc Vitals Group     BP 01/31/16 0633 (!) 143/100     Pulse Rate 01/31/16 0633 88     Resp 01/31/16 0633 18     Temp 01/31/16 0633 97.9 F (36.6 C)     Temp Source 01/31/16 0633 Oral     SpO2 01/31/16 0633 100 %     Weight 01/31/16 0633 140 lb (63.5 kg)     Height 01/31/16 0633 5' (1.524 m)     Head Circumference --      Peak Flow --      Pain Score 01/31/16 0632 5     Pain Loc --      Pain Edu? --      Excl. in GC? --     Constitutional: Alert and oriented. Well appearing and in no acute distress. Eyes: Conjunctivae are normal. PERRL. EOMI. Head: Atraumatic. Nose: No congestion/rhinnorhea. Mouth/Throat: Mucous membranes are moist.  Oropharynx non-erythematous. Neck: No stridor.  Cardiovascular: Normal rate, regular rhythm. Grossly normal heart sounds.  Good peripheral circulation. Respiratory: Normal respiratory effort.  No retractions. Lungs CTAB. Gastrointestinal: Soft and nontender. No distention.  No CVA tenderness. Genitourinary: deferred Musculoskeletal: No lower extremity tenderness nor edema.  No joint effusions. Neurologic:  Normal speech and language. No gross focal neurologic deficits are appreciated. No gait instability. Skin:  Skin is warm, dry and intact. No rash noted. Psychiatric: Mood and affect are normal. Speech and behavior are normal.  ____________________________________________   LABS (all labs ordered are listed, but only abnormal  results are displayed)  Labs Reviewed  COMPREHENSIVE METABOLIC PANEL - Abnormal; Notable for the following:       Result Value   Glucose, Bld 101 (*)    ALT 12 (*)    Total Bilirubin 2.5 (*)    All other components within normal limits  URINALYSIS COMPLETEWITH MICROSCOPIC ([facility] ONLY) - Abnormal; Notable for the following:    Color, Urine YELLOW (*)    APPearance HAZY (*)     Leukocytes, UA 1+ (*)    Squamous Epithelial / LPF 0-5 (*)    All other components within normal limits  CBC WITH DIFFERENTIAL/PLATELET  BILIRUBIN, DIRECT  POCT PREGNANCY, URINE   ____________________________________________  EKG  none ____________________________________________  RADIOLOGY  RUQ ultrasound IMPRESSION: Negative exam. ____________________________________________   PROCEDURES  Procedure(s) performed: None  Procedures  Critical Care performed: No  ____________________________________________   INITIAL IMPRESSION / ASSESSMENT AND PLAN / ED COURSE  Pertinent labs & imaging results that were available during my care of the patient were reviewed by me and considered in my medical decision making (see chart for details).  Misty Osborn is a 25 y.o. female with history of migraine headaches who presents for evaluation of 3 days of generalized weakness, fatigue, poor appetite she thinks may be heat related, gradual onset, constant, mild to moderate, worse when she is at work working in a [employer]. On exam, she is very well-appearing and in no acute distress. Her vital signs are stable, she is afebrile. She has a benign physical examination. Suspect mild dehydration likely due to heat- related illness. We will obtain basic screening labs, urinalysis, urine pregnancy test and give IV fluids. Reassess for disposition. ----------------------------------------- 10:32 AM on 01/31/2016 -----------------------------------------  Patient reports a dramatic improvement after fluids. Her workup thus far is only remarkable for mild T bili elevation at 2.5. Given her nausea and poor appetite, we'll obtain right upper quadrant ultrasound to evaluate for acute gallbladder pathology. Reassess for disposition.  ----------------------------------------- 12:21 PM on 01/31/2016 -----------------------------------------  Unremarkable right upper quadrant ultrasound. I  discussed with the patient that the cause of her mild unconjugated hyperbilirubinemia is not clear that she is to follow-up with her primary care doctor for recheck. She voices understanding of this. We discussed  return precautions, need for close follow-up and she is comfortable with the discharge plan. DC home.  Clinical Course     ____________________________________________   FINAL CLINICAL IMPRESSION(S) / ED DIAGNOSES  Final diagnoses:  Weakness generalized  Nausea  Hyperbilirubinemia      NEW MEDICATIONS STARTED DURING THIS VISIT:  New Prescriptions   No medications on file     Note:  This document was prepared using Dragon voice recognition software and may include unintentional dictation errors.    Gayla Doss, MD 01/31/16 [phone]

## 2016-01-31 NOTE — ED Notes (Signed)
Patient transported to Ultrasound 

## 2016-01-31 NOTE — ED Notes (Signed)
Pt resting in bed, resp even, family at bedside, pt updated  On plan of care, awaiting [place]
